# Patient Record
Sex: Female | Born: 1990 | Race: White | Hispanic: No | Smoking: Current every day smoker
Health system: Southern US, Community
[De-identification: ages and names within clinical notes are randomized; demographics above are authoritative.]

---

## 2014-11-11 ENCOUNTER — Emergency Department
Admit: 2014-11-11 | Disposition: A | Discharge status: Home / Self Care | Payer: Self-pay | Admitting: Emergency Medicine

## 2015-10-21 ENCOUNTER — Emergency Department
Admission: EM | Admit: 2015-10-21 | Discharge: 2015-10-21 | Disposition: A | Discharge status: Home / Self Care | Payer: Self-pay | Attending: Emergency Medicine | Admitting: Emergency Medicine

## 2015-10-21 ENCOUNTER — Encounter: Payer: Self-pay | Admitting: Medical Oncology

## 2015-10-21 DIAGNOSIS — J039 Acute tonsillitis, unspecified: Secondary | ICD-10-CM | POA: Insufficient documentation

## 2015-10-21 DIAGNOSIS — J029 Acute pharyngitis, unspecified: Secondary | ICD-10-CM

## 2015-10-21 MED ORDER — AMOXICILLIN 500 MG PO CAPS: 500.0000 mg | ORAL_CAPSULE | Freq: Two times a day (BID) | ORAL | Status: DC

## 2015-10-21 NOTE — ED Notes (Signed)
Sore throat, fever, body aches, fever since Monday.

## 2015-10-21 NOTE — Discharge Instructions (Signed)
Pharyngitis Pharyngitis is redness, pain, and swelling (inflammation) of your pharynx.  CAUSES  Pharyngitis is usually caused by infection. Most of the time, these infections are from viruses (viral) and are part of a cold. However, sometimes pharyngitis is caused by bacteria (bacterial). Pharyngitis can also be caused by allergies. Viral pharyngitis may be spread from person to person by coughing, sneezing, and personal items or utensils (cups, forks, spoons, toothbrushes). Bacterial pharyngitis may be spread from person to person by more intimate contact, such as kissing.  SIGNS AND SYMPTOMS  Symptoms of pharyngitis include:   Sore throat.   Tiredness (fatigue).   Low-grade fever.   Headache.  Joint pain and muscle aches.  Skin rashes.  Swollen lymph nodes.  Plaque-like film on throat or tonsils (often seen with bacterial pharyngitis). DIAGNOSIS  Your health care provider will ask you questions about your illness and your symptoms. Your medical history, along with a physical exam, is often all that is needed to diagnose pharyngitis. Sometimes, a rapid strep test is done. Other lab tests may also be done, depending on the suspected cause.  TREATMENT  Viral pharyngitis will usually get better in 3-4 days without the use of medicine. Bacterial pharyngitis is treated with medicines that kill germs (antibiotics).  HOME CARE INSTRUCTIONS   Drink enough water and fluids to keep your urine clear or pale yellow.   Only take over-the-counter or prescription medicines as directed by your health care provider:   If you are prescribed antibiotics, make sure you finish them even if you start to feel better.   Do not take aspirin.   Get lots of rest.   Gargle with 8 oz of salt water ( tsp of salt per 1 qt of water) as often as every 1-2 hours to soothe your throat.   Throat lozenges (if you are not at risk for choking) or sprays may be used to soothe your throat. SEEK MEDICAL  CARE IF:   You have large, tender lumps in your neck.  You have a rash.  You cough up green, yellow-brown, or bloody spit. SEEK IMMEDIATE MEDICAL CARE IF:   Your neck becomes stiff.  You drool or are unable to swallow liquids.  You vomit or are unable to keep medicines or liquids down.  You have severe pain that does not go away with the use of recommended medicines.  You have trouble breathing (not caused by a stuffy nose). MAKE SURE YOU:   Understand these instructions.  Will watch your condition.  Will get help right away if you are not doing well or get worse.   This information is not intended to replace advice given to you by your health care provider. Make sure you discuss any questions you have with your health care provider.   Document Released: 07/30/2005 Document Revised: 05/20/2013 Document Reviewed: 04/06/2013 Elsevier Interactive Patient Education [employer]2016 Elsevier Inc.   Your throat culture is pending. You are being treated for strep throat with amoxicillin, take it as directed. [facility] as needed.

## 2015-10-21 NOTE — ED Provider Notes (Signed)
[facility] Emergency Department Provider Note ____________________________________________  Time seen: 1401  I have reviewed the triage vital signs and the nursing notes.  HISTORY  Chief Complaint  Sore Throat  HPI Ann Tanner is a 25 y.o. female is a the ED for evaluation of sore throat, fever, and body aches since Monday.She denies any sick contacts, recent travel. She gives a remote history of strep tonsillitis as a child. She's been dosing ibuprofen intermittently for symptom relief. She denies any rash, nausea, vomiting, or dizziness. A sore throat pain at a 9/10 in triage.  History reviewed. No pertinent past medical history.  There are no active problems to display for this patient.  History reviewed. No pertinent past surgical history.  Current Outpatient Rx  Name  Route  Sig  Dispense  Refill  . amoxicillin (AMOXIL) 500 MG capsule   Oral   Take 1 capsule (500 mg total) by mouth 2 (two) times daily.   20 capsule   0    Allergies Review of patient's allergies indicates no known allergies.  No family history on file.  Social History Social History  Substance Use Topics  . Smoking status: Never Smoker   . Smokeless tobacco: None  . Alcohol Use: None   Review of Systems  Constitutional: Positive for fever. Eyes: Negative for visual changes. ENT: Positive for sore throat. Cardiovascular: Negative for chest pain. Respiratory: Negative for shortness of breath. Gastrointestinal: Negative for abdominal pain, vomiting and diarrhea. Genitourinary: Negative for dysuria. Musculoskeletal: Negative for back pain. Skin: Negative for rash. Neurological: Negative for headaches, focal weakness or numbness. ____________________________________________  PHYSICAL EXAM:  VITAL SIGNS: ED Triage Vitals  Enc Vitals Group     BP 10/21/15 1204 119/74 mmHg     Pulse Rate 10/21/15 1204 101     Resp 10/21/15 1204 18     Temp 10/21/15 1204 98.9 F  (37.2 C)     Temp Source 10/21/15 1204 Oral     SpO2 10/21/15 1204 100 %     Weight 10/21/15 1204 175 lb (79.379 kg)     Height 10/21/15 1204 5\' 3"  (1.6 m)     Head Cir --      Peak Flow --      Pain Score 10/21/15 1204 9     Pain Loc --      Pain Edu? --      Excl. in GC? --    Constitutional: Alert and oriented. Well appearing and in no distress. Head: Normocephalic and atraumatic.      Eyes: Conjunctivae are normal. PERRL. Normal extraocular movements      Ears: Canals clear. TMs intact bilaterally.   Nose: No congestion/rhinorrhea.   Mouth/Throat: Mucous membranes are moist. Uvula is midline and tonsils are enlarged, injected, and multiple stones are appreciated.   Neck: Supple. No thyromegaly. Hematological/Lymphatic/Immunological: Palpable anterior cervical lymphadenopathy. Cardiovascular: Normal rate, regular rhythm.  Respiratory: Normal respiratory effort. No wheezes/rales/rhonchi. Gastrointestinal: Soft and nontender. No distention. Musculoskeletal: Nontender with normal range of motion in all extremities.  Neurologic:  Normal gait without ataxia. Normal speech and language. No gross focal neurologic deficits are appreciated. Skin:  Skin is warm, dry and intact. No rash noted. Psychiatric: Mood and affect are normal. Patient exhibits appropriate insight and judgment. ____________________________________________  INITIAL IMPRESSION / ASSESSMENT AND PLAN / ED COURSE  Patient with an. Treatment for an acute tonsillitis likely due to strep. She was discharged with a prescription for amoxicillin to dose as directed. She will  follow with primary care provider as needed. Work note provided for today as requested. ____________________________________________  FINAL CLINICAL IMPRESSION(S) / ED DIAGNOSES  Final diagnoses:  Sore throat  Acute tonsillitis, unspecified etiology      Lissa Hoard, PA-C 10/21/15 1923  Emily Filbert, MD 10/22/15  [phone]

## 2016-02-11 DIAGNOSIS — N83202 Unspecified ovarian cyst, left side: Secondary | ICD-10-CM | POA: Insufficient documentation

## 2016-02-11 DIAGNOSIS — F17290 Nicotine dependence, other tobacco product, uncomplicated: Secondary | ICD-10-CM | POA: Insufficient documentation

## 2016-02-11 DIAGNOSIS — N760 Acute vaginitis: Secondary | ICD-10-CM | POA: Insufficient documentation

## 2016-02-12 ENCOUNTER — Emergency Department
Admission: EM | Admit: 2016-02-12 | Discharge: 2016-02-12 | Disposition: A | Discharge status: Home / Self Care | Payer: Self-pay | Attending: Emergency Medicine | Admitting: Emergency Medicine

## 2016-02-12 ENCOUNTER — Emergency Department: Payer: Self-pay

## 2016-02-12 ENCOUNTER — Encounter: Payer: Self-pay | Admitting: Emergency Medicine

## 2016-02-12 DIAGNOSIS — N760 Acute vaginitis: Secondary | ICD-10-CM

## 2016-02-12 DIAGNOSIS — N83202 Unspecified ovarian cyst, left side: Secondary | ICD-10-CM

## 2016-02-12 DIAGNOSIS — B9689 Other specified bacterial agents as the cause of diseases classified elsewhere: Secondary | ICD-10-CM

## 2016-02-12 DIAGNOSIS — R52 Pain, unspecified: Secondary | ICD-10-CM

## 2016-02-12 DIAGNOSIS — R102 Pelvic and perineal pain: Secondary | ICD-10-CM

## 2016-02-12 LAB — COMPREHENSIVE METABOLIC PANEL
ALK PHOS: 68 U/L (ref 38–126)
ALT: 14 U/L (ref 14–54)
ANION GAP: 8 (ref 5–15)
AST: 16 U/L (ref 15–41)
Albumin: 4.7 g/dL (ref 3.5–5.0)
BUN: 18 mg/dL (ref 6–20)
CALCIUM: 9.3 mg/dL (ref 8.9–10.3)
CO2: 26 mmol/L (ref 22–32)
CREATININE: 0.75 mg/dL (ref 0.44–1.00)
Chloride: 103 mmol/L (ref 101–111)
Glucose, Bld: 89 mg/dL (ref 65–99)
Potassium: 3.3 mmol/L — ABNORMAL LOW (ref 3.5–5.1)
SODIUM: 137 mmol/L (ref 135–145)
TOTAL PROTEIN: 8.1 g/dL (ref 6.5–8.1)
Total Bilirubin: 0.3 mg/dL (ref 0.3–1.2)

## 2016-02-12 LAB — WET PREP, GENITAL
SPERM: NONE SEEN
Trich, Wet Prep: NONE SEEN
Yeast Wet Prep HPF POC: NONE SEEN

## 2016-02-12 LAB — URINALYSIS COMPLETE WITH MICROSCOPIC (ARMC ONLY)
BILIRUBIN URINE: NEGATIVE
Bacteria, UA: NONE SEEN
Glucose, UA: NEGATIVE mg/dL
KETONES UR: NEGATIVE mg/dL
LEUKOCYTES UA: NEGATIVE
NITRITE: NEGATIVE
PH: 6 (ref 5.0–8.0)
PROTEIN: NEGATIVE mg/dL
SPECIFIC GRAVITY, URINE: 1.017 (ref 1.005–1.030)

## 2016-02-12 LAB — CBC
HCT: 39.6 % (ref 35.0–47.0)
HEMOGLOBIN: 13.8 g/dL (ref 12.0–16.0)
MCH: 30.8 pg (ref 26.0–34.0)
MCHC: 35 g/dL (ref 32.0–36.0)
MCV: 87.9 fL (ref 80.0–100.0)
PLATELETS: 296 10*3/uL (ref 150–440)
RBC: 4.5 MIL/uL (ref 3.80–5.20)
RDW: 13.4 % (ref 11.5–14.5)
WBC: 12.3 10*3/uL — AB (ref 3.6–11.0)

## 2016-02-12 LAB — CHLAMYDIA/NGC RT PCR (ARMC ONLY)
Chlamydia Tr: NOT DETECTED
N gonorrhoeae: NOT DETECTED

## 2016-02-12 LAB — LIPASE, BLOOD: LIPASE: 29 U/L (ref 11–51)

## 2016-02-12 MED ORDER — IBUPROFEN 800 MG PO TABS: 800.0000 mg | ORAL_TABLET | Freq: Three times a day (TID) | ORAL | Status: DC | PRN

## 2016-02-12 MED ORDER — ONDANSETRON HCL 4 MG/2ML IJ SOLN
4.0000 mg | Freq: Once | INTRAMUSCULAR | Status: AC
  Administered 2016-02-12: 4 mg via INTRAVENOUS
  Filled 2016-02-12: qty 2

## 2016-02-12 MED ORDER — SODIUM CHLORIDE 0.9 % IV BOLUS (SEPSIS)
500.0000 mL | Freq: Once | INTRAVENOUS | Status: AC
  Administered 2016-02-12: 500 mL via INTRAVENOUS

## 2016-02-12 MED ORDER — HYDROCODONE-ACETAMINOPHEN 5-325 MG PO TABS: 1.0000 | ORAL_TABLET | Freq: Four times a day (QID) | ORAL | Status: DC | PRN

## 2016-02-12 MED ORDER — METRONIDAZOLE 500 MG PO TABS: 500.0000 mg | ORAL_TABLET | Freq: Two times a day (BID) | ORAL | Status: DC

## 2016-02-12 MED ORDER — IOPAMIDOL (ISOVUE-300) INJECTION 61%
100.0000 mL | Freq: Once | INTRAVENOUS | Status: AC | PRN
  Administered 2016-02-12: 100 mL via INTRAVENOUS

## 2016-02-12 MED ORDER — HYDROCODONE-ACETAMINOPHEN 5-325 MG PO TABS
1.0000 | ORAL_TABLET | Freq: Once | ORAL | Status: DC
  Filled 2016-02-12: qty 1

## 2016-02-12 MED ORDER — DIATRIZOATE MEGLUMINE & SODIUM 66-10 % PO SOLN
15.0000 mL | Freq: Once | ORAL | Status: AC
  Administered 2016-02-12: 15 mL via ORAL

## 2016-02-12 MED ORDER — IBUPROFEN 800 MG PO TABS
800.0000 mg | ORAL_TABLET | Freq: Once | ORAL | Status: AC
Start: 2016-02-12 — End: 2016-02-12
  Administered 2016-02-12: 800 mg via ORAL
  Filled 2016-02-12: qty 1

## 2016-02-12 MED ORDER — MORPHINE SULFATE (PF) 2 MG/ML IV SOLN
2.0000 mg | Freq: Once | INTRAVENOUS | Status: AC
  Administered 2016-02-12: 2 mg via INTRAVENOUS
  Filled 2016-02-12: qty 1

## 2016-02-12 NOTE — Discharge Instructions (Signed)
1. You may take pain medicines as needed (Motrin/Norco #15). 2. Take antibiotic as prescribed (Flagyl 500 mg twice daily 7 days). 3. Return to the ER for worsening symptoms, persistent vomiting, difficulty breathing or other concerns.  Pelvic Pain, Female Female pelvic pain can be caused by many different things and start from a variety of places. Pelvic pain refers to pain that is located in the lower half of the abdomen and between your hips. The pain may occur over a short period of time (acute) or may be reoccurring (chronic). The cause of pelvic pain may be related to disorders affecting the female reproductive organs (gynecologic), but it may also be related to the bladder, kidney stones, an intestinal complication, or muscle or skeletal problems. Getting help right away for pelvic pain is important, especially if there has been severe, sharp, or a sudden onset of unusual pain. It is also important to get help right away because some types of pelvic pain can be life threatening.  CAUSES  Below are only some of the causes of pelvic pain. The causes of pelvic pain can be in one of several categories.   Gynecologic.  Pelvic inflammatory disease.  Sexually transmitted infection.  Ovarian cyst or a twisted ovarian ligament (ovarian torsion).  Uterine lining that grows outside the uterus (endometriosis).  Fibroids, cysts, or tumors.  Ovulation.  Pregnancy.  Pregnancy that occurs outside the uterus (ectopic pregnancy).  Miscarriage.  Labor.  Abruption of the placenta or ruptured uterus.  Infection.  Uterine infection (endometritis).  Bladder infection.  Diverticulitis.  Miscarriage related to a uterine infection (septic abortion).  Bladder.  Inflammation of the bladder (cystitis).  Kidney stone(s).  Gastrointestinal.  Constipation.  Diverticulitis.  Neurologic.  Trauma.  Feeling pelvic pain because of mental or emotional causes (psychosomatic).  Cancers of  the bowel or pelvis. EVALUATION  Your caregiver will want to take a careful history of your concerns. This includes recent changes in your health, a careful gynecologic history of your periods (menses), and a sexual history. Obtaining your family history and medical history is also important. Your caregiver may suggest a pelvic exam. A pelvic exam will help identify the location and severity of the pain. It also helps in the evaluation of which organ system may be involved. In order to identify the cause of the pelvic pain and be properly treated, your caregiver may order tests. These tests may include:   A pregnancy test.  Pelvic ultrasonography.  An X-ray exam of the abdomen.  A urinalysis or evaluation of vaginal discharge.  Blood tests. HOME CARE INSTRUCTIONS   Only take over-the-counter or prescription medicines for pain, discomfort, or fever as directed by your caregiver.   Rest as directed by your caregiver.   Eat a balanced diet.   Drink enough fluids to make your urine clear or pale yellow, or as directed.   Avoid sexual intercourse if it causes pain.   Apply warm or cold compresses to the lower abdomen depending on which one helps the pain.   Avoid stressful situations.   Keep a journal of your pelvic pain. Write down when it started, where the pain is located, and if there are things that seem to be associated with the pain, such as food or your menstrual cycle.  Follow up with your caregiver as directed.  SEEK MEDICAL CARE IF:  Your medicine does not help your pain.  You have abnormal vaginal discharge. SEEK IMMEDIATE MEDICAL CARE IF:   You have heavy bleeding  from the vagina.   Your pelvic pain increases.   You feel light-headed or faint.   You have chills.   You have pain with urination or blood in your urine.   You have uncontrolled diarrhea or vomiting.   You have a fever or persistent symptoms for more than 3 days.  You have a fever  and your symptoms suddenly get worse.   You are being physically or sexually abused.   This information is not intended to replace advice given to you by your health care provider. Make sure you discuss any questions you have with your health care provider.   Document Released: 06/26/2004 Document Revised: 04/20/2015 Document Reviewed: 11/19/2011 Elsevier Interactive Patient Education 2016 Elsevier Inc.  Bacterial Vaginosis Bacterial vaginosis is a vaginal infection that occurs when the normal balance of bacteria in the vagina is disrupted. It results from an overgrowth of certain bacteria. This is the most common vaginal infection in women of childbearing age. Treatment is important to prevent complications, especially in pregnant women, as it can cause a premature delivery. CAUSES  Bacterial vaginosis is caused by an increase in harmful bacteria that are normally present in smaller amounts in the vagina. Several different kinds of bacteria can cause bacterial vaginosis. However, the reason that the condition develops is not fully understood. RISK FACTORS Certain activities or behaviors can put you at an increased risk of developing bacterial vaginosis, including:  Having a new sex partner or multiple sex partners.  Douching.  Using an intrauterine device (IUD) for contraception. Women do not get bacterial vaginosis from toilet seats, bedding, swimming pools, or contact with objects around them. SIGNS AND SYMPTOMS  Some women with bacterial vaginosis have no signs or symptoms. Common symptoms include:  Grey vaginal discharge.  A fishlike odor with discharge, especially after sexual intercourse.  Itching or burning of the vagina and vulva.  Burning or pain with urination. DIAGNOSIS  Your health care provider will take a medical history and examine the vagina for signs of bacterial vaginosis. A sample of vaginal fluid may be taken. Your health care provider will look at this sample  under a microscope to check for bacteria and abnormal cells. A vaginal pH test may also be done.  TREATMENT  Bacterial vaginosis may be treated with antibiotic medicines. These may be given in the form of a pill or a vaginal cream. A second round of antibiotics may be prescribed if the condition comes back after treatment. Because bacterial vaginosis increases your risk for sexually transmitted diseases, getting treated can help reduce your risk for chlamydia, gonorrhea, HIV, and herpes. HOME CARE INSTRUCTIONS   Only take over-the-counter or prescription medicines as directed by your health care provider.  If antibiotic medicine was prescribed, take it as directed. Make sure you finish it even if you start to feel better.  Tell all sexual partners that you have a vaginal infection. They should see their health care provider and be treated if they have problems, such as a mild rash or itching.  During treatment, it is important that you follow these instructions:  Avoid sexual activity or use condoms correctly.  Do not douche.  Avoid alcohol as directed by your health care provider.  Avoid breastfeeding as directed by your health care provider. SEEK MEDICAL CARE IF:   Your symptoms are not improving after 3 days of treatment.  You have increased discharge or pain.  You have a fever. MAKE SURE YOU:   Understand these instructions.  Will  watch your condition.  Will get help right away if you are not doing well or get worse. FOR MORE INFORMATION  Centers for Disease Control and Prevention, Division of STD Prevention: [employer] American Sexual Health Association (ASHA): www.ashastd.org    This information is not intended to replace advice given to you by your health care provider. Make sure you discuss any questions you have with your health care provider.   Document Released: 07/30/2005 Document Revised: 08/20/2014 Document Reviewed: 03/11/2013 Elsevier Interactive Patient  Education [employer]

## 2016-02-12 NOTE — ED Notes (Signed)
Patient transported to CT via stretcher.

## 2016-02-12 NOTE — ED Provider Notes (Signed)
[facility] Emergency Department Provider Note   ____________________________________________  Time seen: Approximately 1:05 AM  I have reviewed the triage vital signs and the nursing notes.   HISTORY  Chief Complaint Abdominal Pain    HPI Ann MattesVannesa Tanner is a 25 y.o. female who presents to the ED from home with a chief complaint of right lower quadrant pain. Patient reports pain to her right lower abdomen which began at 3 AM while awake. Reports nonradiating, intermittent, sharp pain throughout the day. Symptoms associated with nausea but no fever, chills, chest pain, shortness of breath, vomiting, diarrhea, dysuria, vaginal discharge or bleeding. Last sexual intercourse yesterday.Nothing makes her symptoms better or worse.   Past medical history None  There are no active problems to display for this patient.   History reviewed. No pertinent past surgical history.  Current Outpatient Rx  Name  Route  Sig  Dispense  Refill  . HYDROcodone-acetaminophen (NORCO) 5-325 MG tablet   Oral   Take 1 tablet by mouth every 6 (six) hours as needed for moderate pain.   15 tablet   0   . ibuprofen (ADVIL,MOTRIN) 800 MG tablet   Oral   Take 1 tablet (800 mg total) by mouth every 8 (eight) hours as needed for moderate pain.   15 tablet   0   . metroNIDAZOLE (FLAGYL) 500 MG tablet   Oral   Take 1 tablet (500 mg total) by mouth 2 (two) times daily.   14 tablet   0     Allergies Review of patient's allergies indicates no known allergies.  Family history Half sister with ovarian cysts  Social History Social History  Substance Use Topics  . Smoking status: Current Every Day Smoker    Types: E-cigarettes  . Smokeless tobacco: None  . Alcohol Use: No    Review of Systems  Constitutional: No fever/chills. Eyes: No visual changes. ENT: No sore throat. Cardiovascular: Denies chest pain. Respiratory: Denies shortness of  breath. Gastrointestinal: Positive for abdominal pain, nausea. No vomiting.  No diarrhea.  No constipation. Genitourinary: Negative for dysuria. Musculoskeletal: Negative for back pain. Skin: Negative for rash. Neurological: Negative for headaches, focal weakness or numbness.  10-point ROS otherwise negative.  ____________________________________________   PHYSICAL EXAM:  VITAL SIGNS: ED Triage Vitals  Enc Vitals Group     BP 02/11/16 2359 119/65 mmHg     Pulse Rate 02/11/16 2359 65     Resp 02/11/16 2359 18     Temp 02/11/16 2359 98.2 F (36.8 C)     Temp Source 02/11/16 2359 Oral     SpO2 02/11/16 2359 100 %     Weight 02/11/16 2359 165 lb (74.844 kg)     Height 02/11/16 2359 5\' 3"  (1.6 m)     Head Cir --      Peak Flow --      Pain Score 02/12/16 0000 6     Pain Loc --      Pain Edu? --      Excl. in GC? --     Constitutional: Alert and oriented. Well appearing and in no acute distress. Eyes: Conjunctivae are normal. PERRL. EOMI. Head: Atraumatic. Nose: No congestion/rhinnorhea. Mouth/Throat: Mucous membranes are moist.  Oropharynx non-erythematous. Neck: No stridor.   Cardiovascular: Normal rate, regular rhythm. Grossly normal heart sounds.  Good peripheral circulation. Respiratory: Normal respiratory effort.  No retractions. Lungs CTAB. Gastrointestinal: Soft and mildly tender to palpation right pelvis without rebound or guarding. No distention. No  abdominal bruits. No CVA tenderness. Musculoskeletal: No lower extremity tenderness nor edema.  No joint effusions. Neurologic:  Normal speech and language. No gross focal neurologic deficits are appreciated. No gait instability. Skin:  Skin is warm, dry and intact. No rash noted. Psychiatric: Mood and affect are normal. Speech and behavior are normal.  ____________________________________________   LABS (all labs ordered are listed, but only abnormal results are displayed)  Labs Reviewed  WET PREP, GENITAL -  Abnormal; Notable for the following:    Clue Cells Wet Prep HPF POC PRESENT (*)    WBC, Wet Prep HPF POC FEW (*)    All other components within normal limits  COMPREHENSIVE METABOLIC PANEL - Abnormal; Notable for the following:    Potassium 3.3 (*)    All other components within normal limits  CBC - Abnormal; Notable for the following:    WBC 12.3 (*)    All other components within normal limits  URINALYSIS COMPLETEWITH MICROSCOPIC ([facility] ONLY) - Abnormal; Notable for the following:    Color, Urine YELLOW (*)    APPearance CLEAR (*)    Hgb urine dipstick 1+ (*)    Squamous Epithelial / LPF 0-5 (*)    All other components within normal limits  CHLAMYDIA/NGC RT PCR ([facility] ONLY)  LIPASE, BLOOD  POC URINE PREG, ED   ____________________________________________  EKG  None ____________________________________________  RADIOLOGY  Pelvic US interpreted per Dr. Gwenyth Benderadparvar: Left ovarian complex cysts as described. Follow-up after 2 menstrual cycles recommended.  Unremarkable uterus and the right ovary.  CT abdomen/pelvis interpreted per Dr. Clovis RileyMitchell: 1. Normal appendix. 2. Mildly complex left ovarian cysts, most likely benign. ____________________________________________   PROCEDURES  Procedure(s) performed:   Pelvic exam: External exam WNL without rashes, lesions or vesicles. Speculum exam with scant white discharge. Bimanual exam WNL.  Critical Care performed: No  ____________________________________________   INITIAL IMPRESSION / ASSESSMENT AND PLAN / ED COURSE  Pertinent labs & imaging results that were available during my care of the patient were reviewed by me and considered in my medical decision making (see chart for details).  25 year old female who presents with right pelvic pain. Laboratory and urinalysis results notable for mild leukocytosis. Will begin with pelvic ultrasound to evaluate for ovarian  pathology.  ----------------------------------------- 3:42 AM on 02/12/2016 -----------------------------------------  Updated patient of ultrasound results. Although she does her bilaterally, ultrasound does not explain etiology of right pelvic pain. Will proceed with CT abdomen/pelvis.  ----------------------------------------- 5:34 AM on 02/12/2016 -----------------------------------------  Updated patient of CT results. Plan for NSAIDs and analgesia, and GYN follow-up for ovarian cyst. Strict return precautions given. Patient verbalizes understanding and agrees with plan of care. ____________________________________________   FINAL CLINICAL IMPRESSION(S) / ED DIAGNOSES  Final diagnoses:  Pain  Bacterial vaginosis  Cyst of left ovary  Pelvic pain in female      NEW MEDICATIONS STARTED DURING THIS VISIT:  New Prescriptions   HYDROCODONE-ACETAMINOPHEN (NORCO) 5-325 MG TABLET    Take 1 tablet by mouth every 6 (six) hours as needed for moderate pain.   IBUPROFEN (ADVIL,MOTRIN) 800 MG TABLET    Take 1 tablet (800 mg total) by mouth every 8 (eight) hours as needed for moderate pain.   METRONIDAZOLE (FLAGYL) 500 MG TABLET    Take 1 tablet (500 mg total) by mouth 2 (two) times daily.     Note:  This document was prepared using Dragon voice recognition software and may include unintentional dictation errors.    Irean HongJade J Mililani Murthy, MD 02/12/16 0730

## 2016-02-12 NOTE — ED Notes (Signed)
POC URINE PREGNANCY TEST WAS NEGATIVE

## 2016-02-12 NOTE — ED Notes (Signed)
Pt ambulatory to triage with no difficulty. Pt reports pain to her right lower abd that started at 3 am. Pain has been intermittent. Pt reports +nausea but denies vomiting or diarrhea.Pt denies fever or urinary sx.

## 2016-03-31 ENCOUNTER — Encounter: Payer: Self-pay | Admitting: Emergency Medicine

## 2016-03-31 ENCOUNTER — Emergency Department
Admission: EM | Admit: 2016-03-31 | Discharge: 2016-03-31 | Disposition: A | Discharge status: Home / Self Care | Payer: Self-pay | Attending: Emergency Medicine | Admitting: Emergency Medicine

## 2016-03-31 DIAGNOSIS — J029 Acute pharyngitis, unspecified: Secondary | ICD-10-CM | POA: Insufficient documentation

## 2016-03-31 DIAGNOSIS — F17210 Nicotine dependence, cigarettes, uncomplicated: Secondary | ICD-10-CM | POA: Insufficient documentation

## 2016-03-31 NOTE — ED Triage Notes (Signed)
Pt with sore throat on the right side started last night.

## 2016-03-31 NOTE — ED Provider Notes (Signed)
[facility] Emergency Department Provider Note  ____________________________________________   First MD Initiated Contact with Patient 03/31/16 0802     (approximate)  I have reviewed the triage vital signs and the nursing notes.   HISTORY  Chief Complaint Sore Throat    HPI Ann Tanner is a 25 y.o. female is here complaining of sore throat which began yesterday. Patient denies any fever or chills. She has had no cough or congestion. Patient still is able to swallow her saliva and talk without any difficulty. Patient is not taking any over-the-counter medication since her symptoms. She denies any ear pain. She rates her pain as a 7 out of 10.   History reviewed. No pertinent past medical history.  There are no active problems to display for this patient.   History reviewed. No pertinent surgical history.  Prior to Admission medications   Not on File    Allergies Review of patient's allergies indicates no known allergies.  No family history on file.  Social History Social History  Substance Use Topics  . Smoking status: Current Every Day Smoker    Types: E-cigarettes  . Smokeless tobacco: Never Used  . Alcohol use No    Review of Systems Constitutional: No fever/chills ENT: Positive sore throat. Cardiovascular: Denies chest pain. Respiratory: Denies shortness of breath. Gastrointestinal: No abdominal pain.  No nausea, no vomiting.   Musculoskeletal: Negative for back pain. Skin: Negative for rash. Neurological: Negative for headaches, focal weakness or numbness.  10-point ROS otherwise negative.  ____________________________________________   PHYSICAL EXAM:  VITAL SIGNS: ED Triage Vitals  Enc Vitals Group     BP 03/31/16 0746 110/66     Pulse Rate 03/31/16 0746 87     Resp 03/31/16 0746 20     Temp 03/31/16 0746 98.3 F (36.8 C)     Temp Source 03/31/16 0746 Oral     SpO2 03/31/16 0746 98 %     Weight 03/31/16 0744  165 lb (74.8 kg)     Height 03/31/16 0744 5\' 3"  (1.6 m)     Head Circumference --      Peak Flow --      Pain Score 03/31/16 0744 7     Pain Loc --      Pain Edu? --      Excl. in GC? --     Constitutional: Alert and oriented. Well appearing and in no acute distress. Eyes: Conjunctivae are normal. PERRL. EOMI. Head: Atraumatic. Nose: No congestion/rhinnorhea.  EACs and TMs are clear bilaterally. Mouth/Throat: Mucous membranes are moist.  Oropharynx non-erythematous. No exudate or tonsil enlargement noted. Neck: No stridor.   Hematological/Lymphatic/Immunilogical: No cervical lymphadenopathy. Cardiovascular: Normal rate, regular rhythm. Grossly normal heart sounds.  Good peripheral circulation. Respiratory: Normal respiratory effort.  No retractions. Lungs CTAB. Musculoskeletal: Moves upper and lower extremities without any difficulty. Normal gait was noted. Neurologic:  Normal speech and language. No gross focal neurologic deficits are appreciated. No gait instability. Skin:  Skin is warm, dry and intact. No rash noted. Psychiatric: Mood and affect are normal. Speech and behavior are normal.  ____________________________________________   LABS (all labs ordered are listed, but only abnormal results are displayed)  Labs Reviewed - No data to display  PROCEDURES  Procedure(s) performed: None  Procedures  Critical Care performed: No  ____________________________________________   INITIAL IMPRESSION / ASSESSMENT AND PLAN / ED COURSE  Pertinent labs & imaging results that were available during my care of the patient were reviewed by  me and considered in my medical decision making (see chart for details).    Clinical Course   Strep test in the emergency room was negative. Patient was told this most likely is viral pharyngitis and she may increase fluids, take Tylenol, and decongestant or antihistamine of choice for posterior drainage. [facility] if any continued problems.  ____________________________________________   FINAL CLINICAL IMPRESSION(S) / ED DIAGNOSES  Final diagnoses:  Viral pharyngitis      NEW MEDICATIONS STARTED DURING THIS VISIT:  Discharge Medication List as of 03/31/2016  8:47 AM       Note:  This document was prepared using Dragon voice recognition software and may include unintentional dictation errors.    Tommi Rumpshonda L Fidelis Loth, PA-C 03/31/16 [id number]    Minna AntisKevin Paduchowski, MD 03/31/16 1500

## 2016-03-31 NOTE — Discharge Instructions (Signed)
Increase fluids. Tylenol if needed for throat pain. Sudafed, Zyrtec, Claritin as needed for nasal congestion and posterior drainage. [facility] if any continued problems.

## 2021-08-01 ENCOUNTER — Emergency Department
Admission: EM | Admit: 2021-08-01 | Discharge: 2021-08-01 | Disposition: A | Discharge status: Home / Self Care | Payer: Self-pay | Attending: Emergency Medicine | Admitting: Emergency Medicine

## 2021-08-01 ENCOUNTER — Other Ambulatory Visit: Payer: Self-pay

## 2021-08-01 ENCOUNTER — Encounter: Payer: Self-pay | Admitting: Intensive Care

## 2021-08-01 ENCOUNTER — Emergency Department: Payer: Self-pay

## 2021-08-01 DIAGNOSIS — R102 Pelvic and perineal pain: Secondary | ICD-10-CM

## 2021-08-01 DIAGNOSIS — R1032 Left lower quadrant pain: Secondary | ICD-10-CM | POA: Insufficient documentation

## 2021-08-01 DIAGNOSIS — F17290 Nicotine dependence, other tobacco product, uncomplicated: Secondary | ICD-10-CM | POA: Insufficient documentation

## 2021-08-01 LAB — URINALYSIS, ROUTINE W REFLEX MICROSCOPIC
Bacteria, UA: NONE SEEN
Bilirubin Urine: NEGATIVE
Glucose, UA: NEGATIVE mg/dL
Hgb urine dipstick: NEGATIVE
Ketones, ur: NEGATIVE mg/dL
Nitrite: NEGATIVE
Protein, ur: NEGATIVE mg/dL
Specific Gravity, Urine: 1.019 (ref 1.005–1.030)
WBC, UA: >50 WBC/hpf — ABNORMAL HIGH (ref 0–5)
pH: 7 (ref 5.0–8.0)

## 2021-08-01 LAB — CBC WITH DIFFERENTIAL/PLATELET
Abs Immature Granulocytes: 0.03 10*3/uL (ref 0.00–0.07)
Basophils Absolute: 0.1 10*3/uL (ref 0.0–0.1)
Basophils Relative: 1 %
Eosinophils Absolute: 0.2 10*3/uL (ref 0.0–0.5)
Eosinophils Relative: 2 %
HCT: 41.9 % (ref 36.0–46.0)
Hemoglobin: 14.6 g/dL (ref 12.0–15.0)
Immature Granulocytes: 0 %
Lymphocytes Relative: 32 %
Lymphs Abs: 4.3 10*3/uL — ABNORMAL HIGH (ref 0.7–4.0)
MCH: 31.9 pg (ref 26.0–34.0)
MCHC: 34.8 g/dL (ref 30.0–36.0)
MCV: 91.7 fL (ref 80.0–100.0)
Monocytes Absolute: 0.7 10*3/uL (ref 0.1–1.0)
Monocytes Relative: 5 %
Neutro Abs: 7.9 10*3/uL — ABNORMAL HIGH (ref 1.7–7.7)
Neutrophils Relative %: 60 %
Platelets: 432 10*3/uL — ABNORMAL HIGH (ref 150–400)
RBC: 4.57 MIL/uL (ref 3.87–5.11)
RDW: 12.1 % (ref 11.5–15.5)
WBC: 13.2 10*3/uL — ABNORMAL HIGH (ref 4.0–10.5)
nRBC: 0 % (ref 0.0–0.2)

## 2021-08-01 LAB — COMPREHENSIVE METABOLIC PANEL WITH GFR
ALT: 20 U/L (ref 0–44)
AST: 21 U/L (ref 15–41)
Albumin: 4.5 g/dL (ref 3.5–5.0)
Alkaline Phosphatase: 82 U/L (ref 38–126)
Anion gap: 5 (ref 5–15)
BUN: 17 mg/dL (ref 6–20)
CO2: 29 mmol/L (ref 22–32)
Calcium: 9.6 mg/dL (ref 8.9–10.3)
Chloride: 104 mmol/L (ref 98–111)
Creatinine, Ser: 0.91 mg/dL (ref 0.44–1.00)
GFR, Estimated: >60 mL/min
Glucose, Bld: 83 mg/dL (ref 70–99)
Potassium: 4.2 mmol/L (ref 3.5–5.1)
Sodium: 138 mmol/L (ref 135–145)
Total Bilirubin: 0.6 mg/dL (ref 0.3–1.2)
Total Protein: 8.3 g/dL — ABNORMAL HIGH (ref 6.5–8.1)

## 2021-08-01 LAB — POC URINE PREG, ED: Preg Test, Ur: NEGATIVE

## 2021-08-01 MED ORDER — NITROFURANTOIN MONOHYD MACRO 100 MG PO CAPS
100.0000 mg | ORAL_CAPSULE | Freq: Once | ORAL | Status: AC
  Administered 2021-08-01: 20:00:00 100 mg via ORAL
  Filled 2021-08-01: qty 1

## 2021-08-01 MED ORDER — IBUPROFEN 400 MG PO TABS
400.0000 mg | ORAL_TABLET | Freq: Once | ORAL | Status: AC
  Administered 2021-08-01: 20:00:00 400 mg via ORAL
  Filled 2021-08-01: qty 1

## 2021-08-01 MED ORDER — NITROFURANTOIN MONOHYD MACRO 100 MG PO CAPS: 100.0000 mg | ORAL_CAPSULE | Freq: Two times a day (BID) | ORAL | 0 refills | Status: AC

## 2021-08-01 NOTE — ED Provider Notes (Signed)
[facility]  ____________________________________________   Event Date/Time   First MD Initiated Contact with Patient 08/01/21 1857     (approximate)  I have reviewed the triage vital signs and the nursing notes.   HISTORY  Chief Complaint Pelvic Pain    HPI Ann Tanner is a 30 y.o. female with past medical history of migraines who presents with left lower quadrant pain.  Symptoms started today while she was at work.  Started as a dull ache in the left lower quadrant/pelvic region that then became severe.  Says she was doubled over at 1 point.  Pain has now much improved but is still mild.  She denies associated urinary symptoms or abnormal vaginal discharge.  She is sexually active with 1 partner and is not concerned about STDs.  She denies dysuria hematuria urgency or frequency.  Denies back pain.  Denies nausea vomiting constipation or diarrhea.  Patient says she has a history of ovarian cyst on the left as well as a hemorrhagic cyst on the right and this feels like when she had a cyst rupture on the right before.         Past Medical History:  Diagnosis Date   Migraine     There are no problems to display for this patient.   History reviewed. No pertinent surgical history.  Prior to Admission medications   Medication Sig Start Date End Date Taking? Authorizing Provider  nitrofurantoin, macrocrystal-monohydrate, (MACROBID) 100 MG capsule Take 1 capsule (100 mg total) by mouth 2 (two) times daily for 5 days. 08/01/21 08/06/21 Yes Rada Hay, MD    Allergies Patient has no known allergies.  History reviewed. No pertinent family history.  Social History Social History   Tobacco Use   Smoking status: Every Day    Types: E-cigarettes   Smokeless tobacco: Never  Vaping Use   Vaping Use: Every day  Substance Use Topics   Alcohol use: Yes    Comment: rare   Drug use: No    Review of Systems   Review of Systems   Constitutional:  Negative for chills and fever.  Gastrointestinal:  Positive for abdominal pain. Negative for constipation, diarrhea, nausea and vomiting.  Genitourinary:  Positive for pelvic pain. Negative for difficulty urinating, dysuria, hematuria, urgency and vaginal discharge.  All other systems reviewed and are negative.  Physical Exam Updated Vital Signs BP (!) 134/94 (BP Location: Left Arm)    Pulse 81    Temp 98.8 F (37.1 C) (Oral)    Resp 16    Ht 5\' 3"  (1.6 m)    Wt 87.5 kg    LMP 07/27/2021 (Exact Date)    SpO2 99%    BMI 34.19 kg/m   Physical Exam Vitals and nursing note reviewed.  Constitutional:      General: She is not in acute distress.    Appearance: Normal appearance.  HENT:     Head: Normocephalic and atraumatic.  Eyes:     General: No scleral icterus.    Conjunctiva/sclera: Conjunctivae normal.  Pulmonary:     Effort: Pulmonary effort is normal. No respiratory distress.     Breath sounds: No stridor.  Abdominal:     General: Abdomen is flat.     Palpations: Abdomen is soft.     Comments: Mild tenderness to deep palpation in the left lower quadrant and suprapubic region, no right lower quadrant tenderness  Musculoskeletal:        General: No deformity or signs  of injury.     Cervical back: Normal range of motion.  Skin:    General: Skin is dry.     Coloration: Skin is not jaundiced or pale.  Neurological:     General: No focal deficit present.     Mental Status: She is alert and oriented to person, place, and time. Mental status is at baseline.  Psychiatric:        Mood and Affect: Mood normal.        Behavior: Behavior normal.     LABS (all labs ordered are listed, but only abnormal results are displayed)  Labs Reviewed  URINALYSIS, ROUTINE W REFLEX MICROSCOPIC - Abnormal; Notable for the following components:      Result Value   Color, Urine YELLOW (*)    APPearance CLOUDY (*)    Leukocytes,Ua LARGE (*)    WBC, UA >50 (*)    All other  components within normal limits  COMPREHENSIVE METABOLIC PANEL - Abnormal; Notable for the following components:   Total Protein 8.3 (*)    All other components within normal limits  CBC WITH DIFFERENTIAL/PLATELET - Abnormal; Notable for the following components:   WBC 13.2 (*)    Platelets 432 (*)    Neutro Abs 7.9 (*)    Lymphs Abs 4.3 (*)    All other components within normal limits  POC URINE PREG, ED   ____________________________________________  EKG   ____________________________________________  RADIOLOGY Ky Barban, personally viewed and evaluated these images (plain radiographs) as part of my medical decision making, as well as reviewing the written report by the radiologist.  ED MD interpretation: I reviewed the ultrasound of the pelvis which is negative for cyst or torsion, confirmed by radiology    ____________________________________________   PROCEDURES  Procedure(s) performed (including Critical Care):  Procedures   ____________________________________________   INITIAL IMPRESSION / ASSESSMENT AND PLAN / ED COURSE     Patient is a 30 year old female who presents with lower quadrant abdominal/pelvic pain.  Started today and at one point was severe causing her to be doubled over.  It is largely resolved although she does still have some mild discomfort in the left lower quadrant.  She really has no other associated symptoms including no other GI symptoms fevers urinary symptoms or abnormal discharge.  Patient is monogamous with 1 partner is not concerned about STDs and is not having vaginal discharge to make me concerned for PID.  Does have a history of ovarian cyst on the left and a ruptured ovarian cyst on the right.  Vital signs within normal limits.  On exam she appears very comfortable.  She does have some mild tenderness to deep palpation the left lower quadrant and suprapubic region however her abdomen is benign she is nontender in the right  lower quadrant.  Pelvic ultrasound was obtained which is negative for cyst or enlarged ovary or any other findings of torsion.  Her urine is positive although there are significant amount of squames.  Labs also notable for mild leukocytosis but this is nonspecific.  We will treat as UTI.  Overall my suspicion for intermittent torsion is low given there is no ovarian mass or cyst to put her at risk for torsion.  Of course this cannot be ruled out definitively by ultrasound and I did recommend that if her pain returns and is as severe as before not improving that she come back to the emergency department.  With Macrobid.  Recommended NSAIDs.  ____________________________________________   FINAL CLINICAL IMPRESSION(S) / ED DIAGNOSES  Final diagnoses:  Pelvic pain     ED Discharge Orders          Ordered    nitrofurantoin, macrocrystal-monohydrate, (MACROBID) 100 MG capsule  2 times daily        08/01/21 1922             Note:  This document was prepared using Dragon voice recognition software and may include unintentional dictation errors.    Rada Hay, MD 08/01/21 [phone]

## 2021-08-01 NOTE — ED Triage Notes (Signed)
Patient c/o left sided pelvic pain. Reports pain had her doubled over. Reports history of right side cyst. Denies abnormal discharge

## 2021-08-01 NOTE — Discharge Instructions (Signed)
Your ultrasound did not show any cysts or abnormalities of your uterus or ovaries.  Your urine sample does look like you likely have a UTI.  Please take the antibiotic twice a day for the next 5 days.  If your pain comes back and is severe like it was before and is not going away, please return to the emergency department for evaluation for ovarian torsion.

## 2021-08-01 NOTE — ED Provider Notes (Signed)
°  Emergency Medicine Provider Triage Evaluation Note  Ann Tanner , a 30 y.o.female,  was evaluated in triage.  Pt complains of pelvic pain.  Patient states that she has been experiencing left-sided pelvic pain for the past few days.  She has a history of ovarian cysts and she says that this feels like one of them.  She said she recently got off her period.  Denies any abnormal discharge or excessive vaginal bleeding.  Denies fever/chills, chest pain, abdominal pain, or back pain.   Review of Systems  Positive: Pelvic pain. Negative: Denies fever, chest pain, vomiting  Physical Exam   Vitals:   08/01/21 1557  BP: (!) 134/94  Pulse: 81  Resp: 16  Temp: 98.8 F (37.1 C)  SpO2: 99%   Gen:   Awake, no distress   Resp:  Normal effort  MSK:   Moves extremities without difficulty  Other:  Abdomen is soft, nondistended.  No tenderness present in abdominal pelvic region  Medical Decision Making  Given the patient's initial medical screening exam, the following diagnostic evaluation has been ordered. The patient will be placed in the appropriate treatment space, once one is available, to complete the evaluation and treatment. I have discussed the plan of care with the patient and I have advised the patient that an ED physician or mid-level practitioner will reevaluate their condition after the test results have been received, as the results may give them additional insight into the type of treatment they may need.    Diagnostics: Labs, quick POC pregnancy test, pelvic ultrasound.  Treatments: none immediately   Varney Daily, PA 08/01/21 1607    Gilles Chiquito, MD 08/01/21 2049

## 2022-04-23 IMAGING — US US PELVIS COMPLETE TRANSABD/TRANSVAG W DUPLEX AND/OR DOPPLER
1 series · 14 of 25 positions shown · non-contrast
Comparison: Sonogram and CT examination dated 02/12/2016.

CLINICAL DATA: Left pelvic pain for 1 day.



[Series 1: us pelvic complete w transvaginal and torsion righ · 14 of 79 slices shown]
[im 1/79]
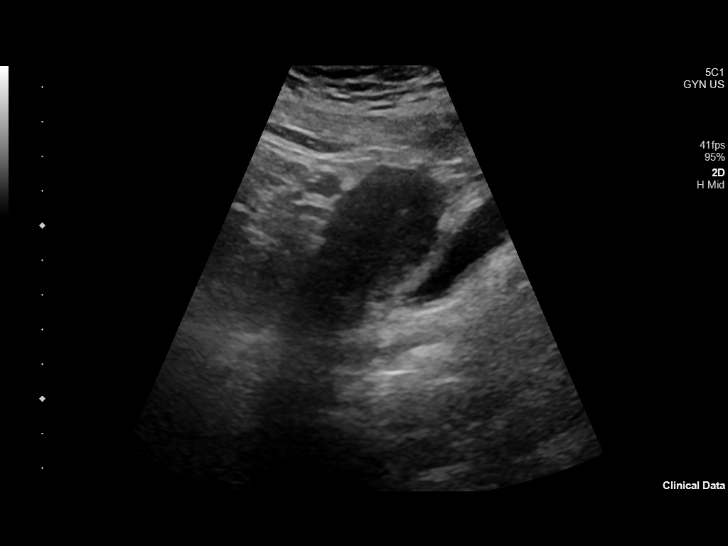
[im 7/79]
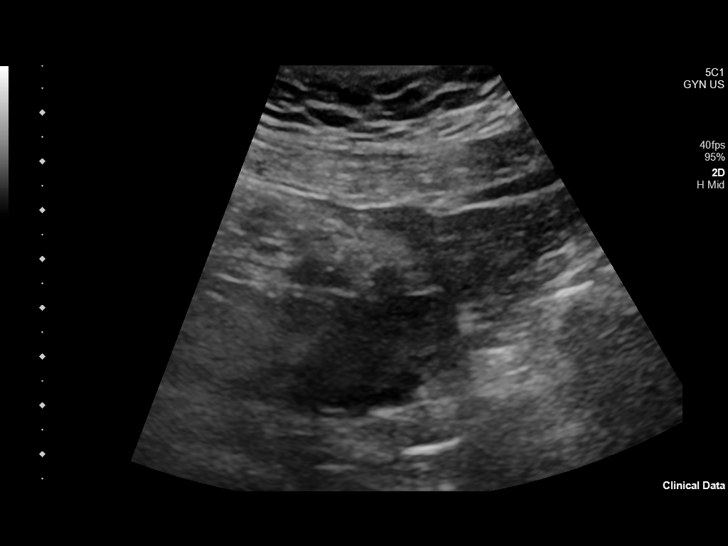
[im 14/79]
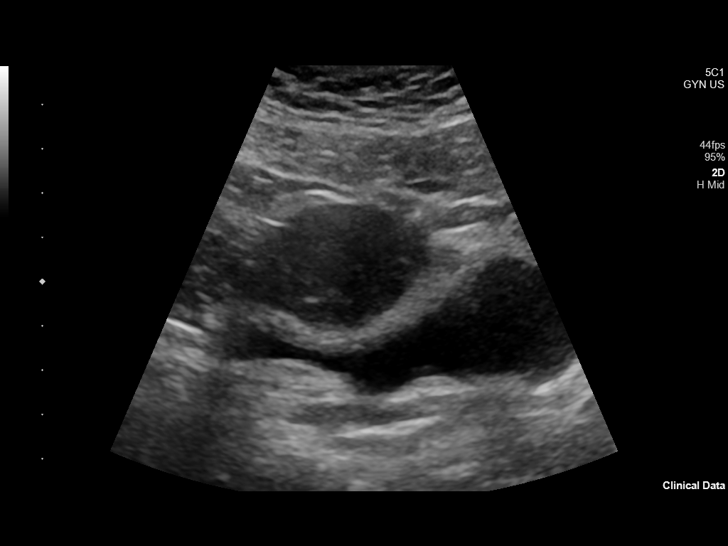
[im 20/79]
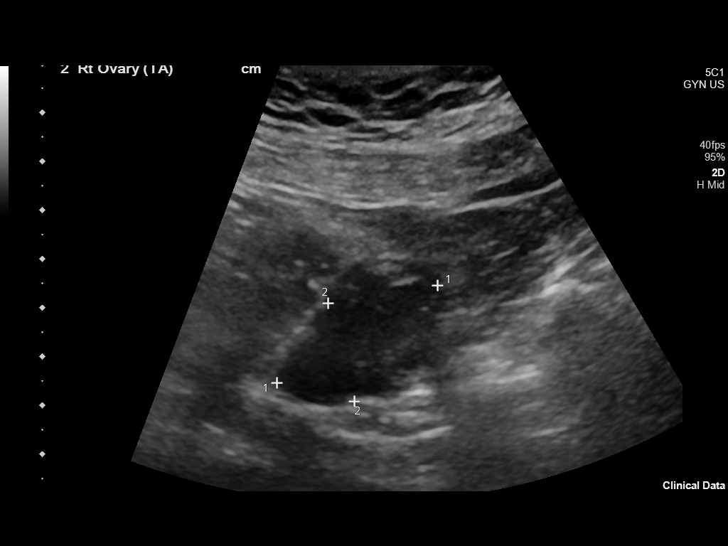
[im 27/79]
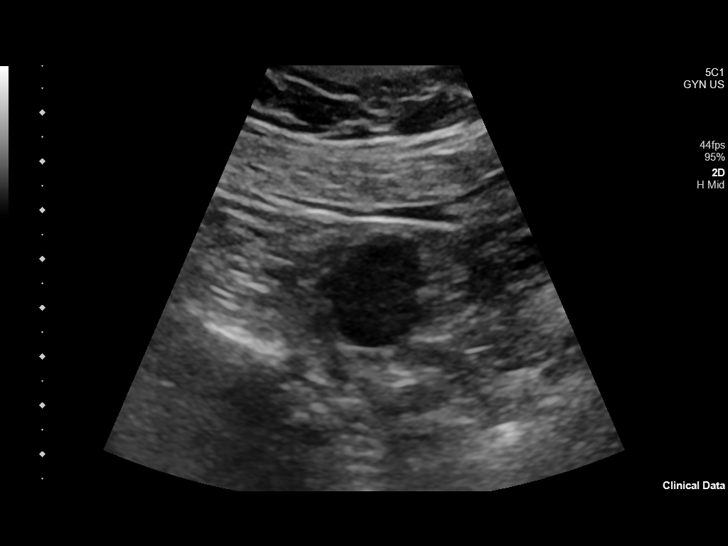
[im 30/79]
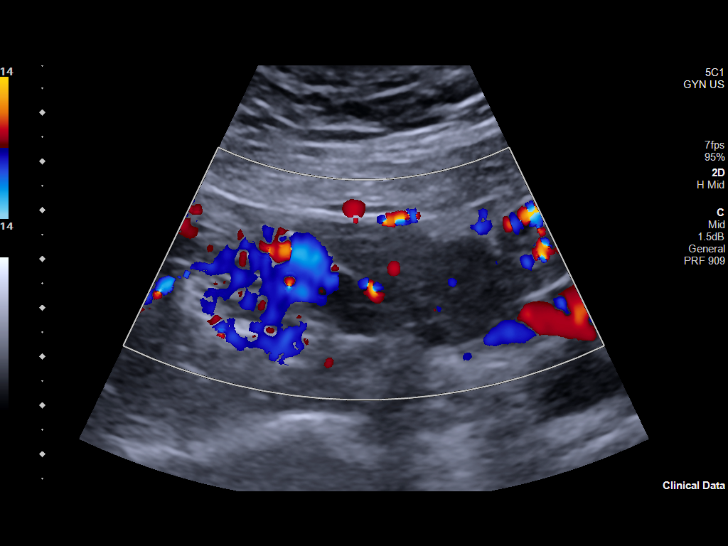
[im 36/79]
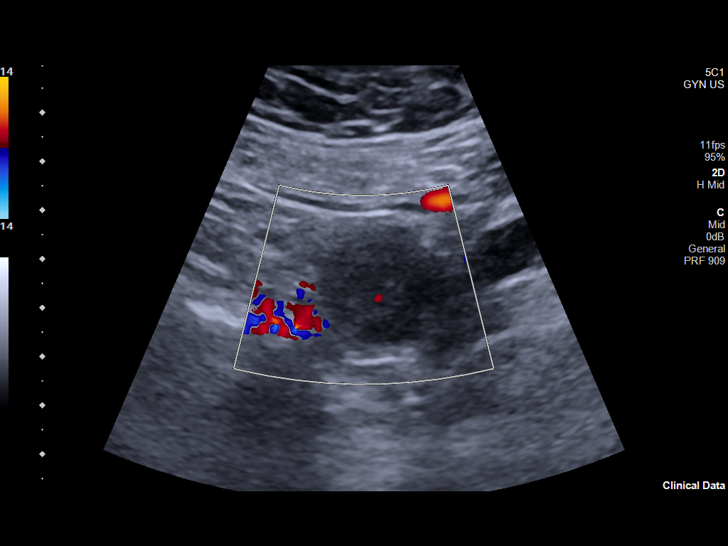
[im 43/79]
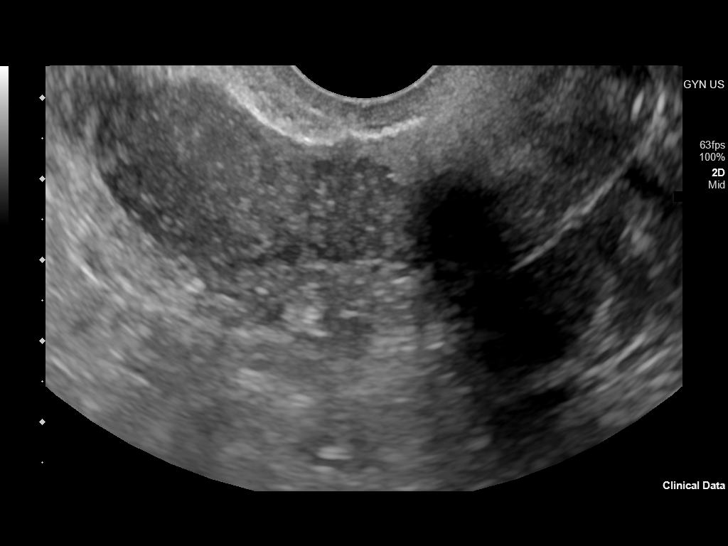
[im 49/79]
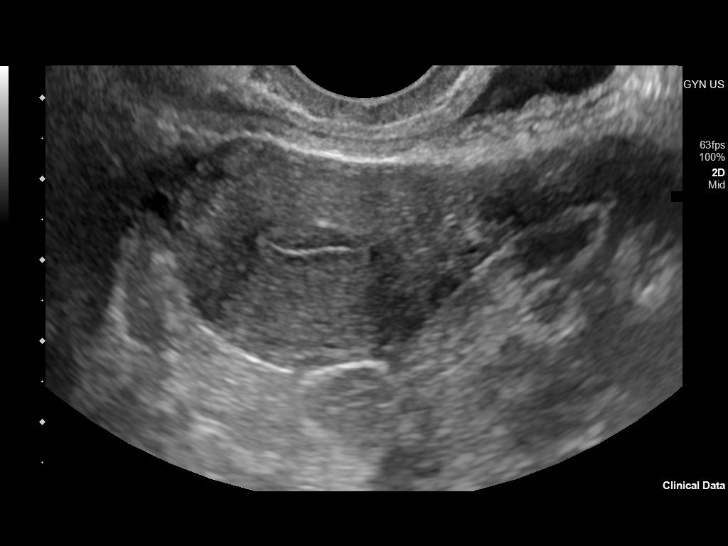
[im 53/79]
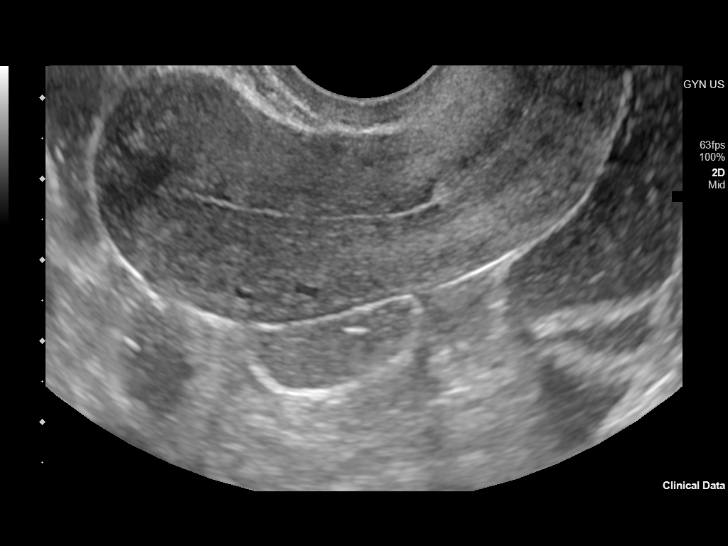
[im 59/79]
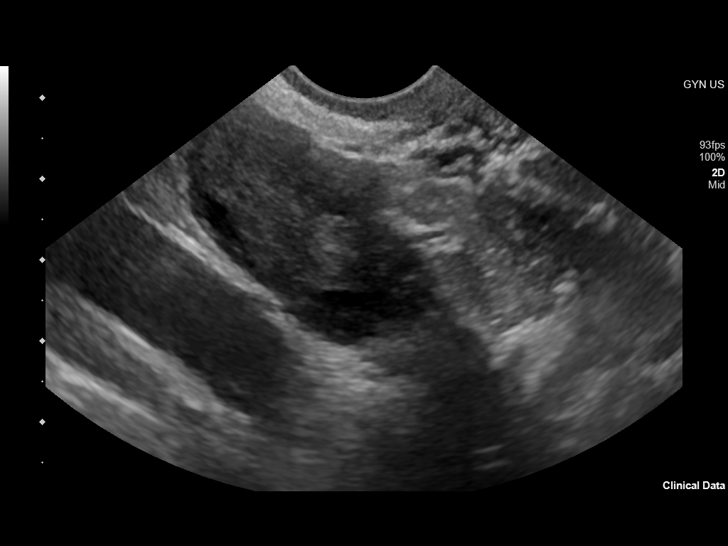
[im 66/79]
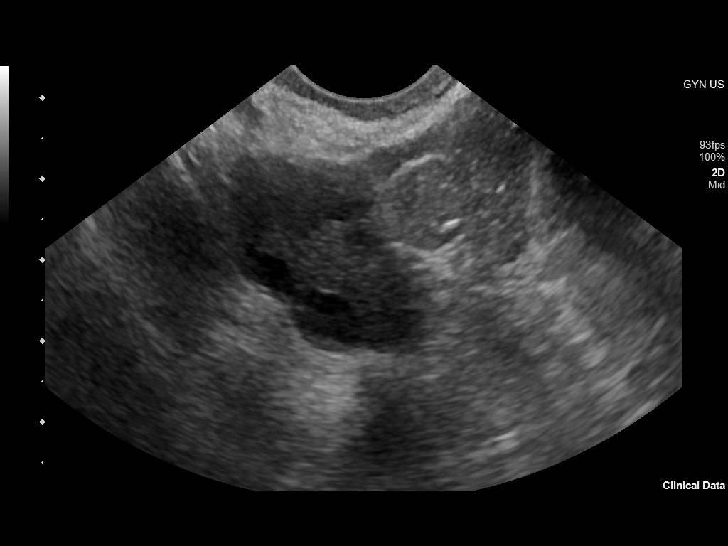
[im 72/79]
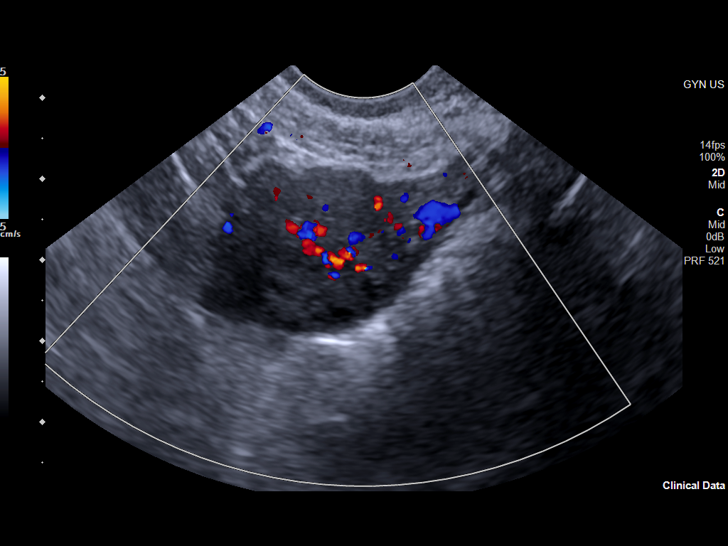
[im 79/79]
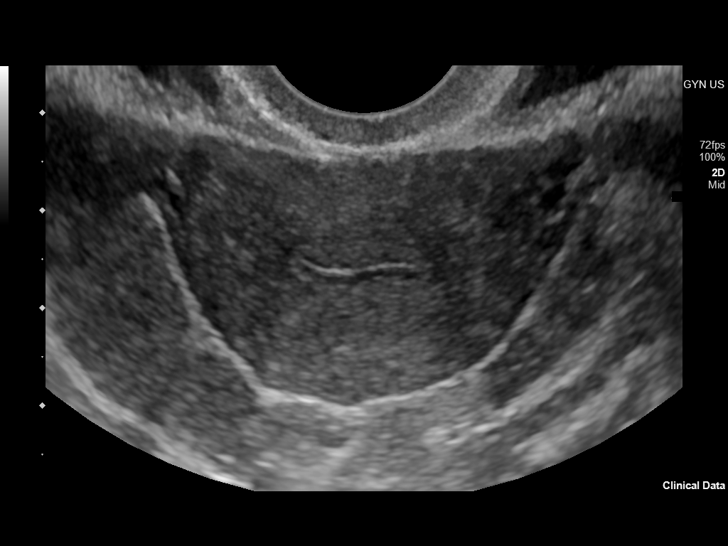

[14 of 25 positions shown; findings below may reference images not displayed]

FINDINGS: Uterus

Measurements: 7.3 x 2.9 x 3.8 = volume: 41.7 mL. No fibroids or
other mass visualized.

Endometrium

Thickness: 3.3.  No focal abnormality visualized.

Right ovary

Measurements: 3.7 x 1.7 x 1.8 = volume: 5.6 mL. Normal appearance/no
adnexal mass.

Left ovary

Measurements: 2.9 x 2.0 x 1.8 = volume: 5.4 mL. Normal appearance/no
adnexal mass.

Other findings

No abnormal free fluid.
IMPRESSION: Normal examination.
